# Patient Record
Sex: Female | Born: 1995 | Hispanic: No | Marital: Single | Smoking: Never smoker
Health system: Southern US, Community
[De-identification: ages and names within clinical notes are randomized; demographics above are authoritative.]

## PROBLEM LIST (undated history)

## (undated) DIAGNOSIS — B019 Varicella without complication: Secondary | ICD-10-CM

## (undated) DIAGNOSIS — H539 Unspecified visual disturbance: Secondary | ICD-10-CM

## (undated) DIAGNOSIS — R51 Headache: Secondary | ICD-10-CM

## (undated) DIAGNOSIS — R569 Unspecified convulsions: Secondary | ICD-10-CM

---
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
## 2019-04-01 ENCOUNTER — Encounter (HOSPITAL_COMMUNITY): Payer: Self-pay | Admitting: Emergency Medicine

## 2019-04-01 ENCOUNTER — Ambulatory Visit (HOSPITAL_COMMUNITY)
Admission: EM | Admit: 2019-04-01 | Discharge: 2019-04-01 | Disposition: A | Discharge status: Home / Self Care | Payer: Self-pay | Attending: Family Medicine | Admitting: Family Medicine

## 2019-04-01 ENCOUNTER — Other Ambulatory Visit: Payer: Self-pay

## 2019-04-01 DIAGNOSIS — K5900 Constipation, unspecified: Secondary | ICD-10-CM

## 2019-04-01 DIAGNOSIS — K6289 Other specified diseases of anus and rectum: Secondary | ICD-10-CM

## 2019-04-01 MED ORDER — POLYETHYLENE GLYCOL 3350 17 G PO PACK: 17.0000 g | PACK | Freq: Every day | ORAL | 0 refills | Status: AC

## 2019-04-01 MED ORDER — HYDROCORTISONE ACETATE 25 MG RE SUPP: 25.0000 mg | Freq: Two times a day (BID) | RECTAL | 0 refills | Status: AC

## 2019-04-01 NOTE — ED Triage Notes (Signed)
Pt complains of rectal pain x3 days.  She states she thinks it is a hemorrhoid, but she thinks it is internal.

## 2019-04-01 NOTE — Discharge Instructions (Addendum)
Use the suppositories as prescribed. Recommend MiraLAX.  You can do 1 scoop twice a day or 1 packet twice a day and to get it evaluated. Make sure you can plenty of fluids and hydrated. Recommended if you are unable to have a bowel movement you may need to purchase an enema over the counter Follow up as needed for continued or worsening symptoms

## 2019-04-02 NOTE — ED Provider Notes (Signed)
[facility]    CSN: [id number] Arrival date & time: 04/01/19  0840      History   Chief Complaint No chief complaint on file.   HPI Kathryn Baker is a 24 y.o. female.   Pt is a 24 year old female that presents with rectal pain x 3 days. Symptoms have been constant. She has not had a BM in 3 days and is worried due to the pain with bearing down. Feels like she is impacted. No rectal bleeding or abdominal pain. She had not tried anything for the symptoms. Reporting that her sister looked at the rectal area and did not see any visible hemorrhoids  nor could she feel any.   ROS per HPI      Past Medical History:  Diagnosis Date  . Headache(784.0)   . Seizures ([place])   . Varicella   . Vision abnormalities     Patient Active Problem List   Diagnosis Date Noted  . Seizures ([place]) 01/26/2012    History reviewed. No pertinent surgical history.  OB History   No obstetric history on file.      Home Medications    Prior to Admission medications   Medication Sig Start Date End Date Taking? Authorizing Provider  levETIRAcetam (KEPPRA) 1000 MG tablet Take 1 tablet (1,000 mg total) by mouth 2 (two) times daily. 02/19/12  Yes Janit Bern, MD  hydrocortisone (ANUSOL-HC) 25 MG suppository Place 1 suppository (25 mg total) rectally 2 (two) times daily. 04/01/19   Loura Halt A, NP  ibuprofen (ADVIL,MOTRIN) 200 MG tablet Take 200 mg by mouth daily as needed. For pain    [provider]  polyethylene glycol (MIRALAX / GLYCOLAX) 17 g packet Take 17 g by mouth daily. 04/01/19   Orvan July, NP    Family History Family History  Problem Relation Age of Onset  . Asthma Sister   . Diabetes Maternal Aunt   . Diabetes Maternal Grandfather     Social History Social History   Tobacco Use  . Smoking status: Never Smoker  . Smokeless tobacco: Never Used  Substance Use Topics  . Alcohol use: No  . Drug use: No     Allergies   Penicillins   Review of  Systems Review of Systems   Physical Exam Triage Vital Signs ED Triage Vitals  Enc Vitals Group     BP 04/01/19 0905 (!) 114/42     Pulse Rate 04/01/19 0905 74     Resp 04/01/19 0905 18     Temp 04/01/19 0905 98.6 F (37 C)     Temp Source 04/01/19 0905 Oral     SpO2 04/01/19 0905 99 %     Weight --      Height --      Head Circumference --      Peak Flow --      Pain Score 04/01/19 0902 7     Pain Loc --      Pain Edu? --      Excl. in [place]? --    No data found.  Updated Vital Signs BP (!) 114/42 (BP Location: Right Arm)   Pulse 74   Temp 98.6 F (37 C) (Oral)   Resp 18   LMP 03/16/2019   SpO2 99%   Visual Acuity Right Eye Distance:   Left Eye Distance:   Bilateral Distance:    Right Eye Near:   Left Eye Near:    Bilateral Near:  Physical Exam Vitals and nursing note reviewed.  Constitutional:      General: She is not in acute distress.    Appearance: Normal appearance. She is not ill-appearing, toxic-appearing or diaphoretic.  HENT:     Head: Normocephalic.     Nose: Nose normal.     Mouth/Throat:     Pharynx: Oropharynx is clear.  Eyes:     Conjunctiva/sclera: Conjunctivae normal.  Pulmonary:     Effort: Pulmonary effort is normal.  Abdominal:     Palpations: Abdomen is soft.     Tenderness: There is no abdominal tenderness.  Genitourinary:    Comments: Deferred.  Musculoskeletal:        General: Normal range of motion.     Cervical back: Normal range of motion.  Skin:    General: Skin is warm and dry.     Findings: No rash.  Neurological:     Mental Status: She is alert.  Psychiatric:        Mood and Affect: Mood normal.      UC Treatments / Results  Labs (all labs ordered are listed, but only abnormal results are displayed) Labs Reviewed - No data to display  EKG   Radiology No results found.  Procedures Procedures (including critical care time)  Medications Ordered in UC Medications - No data to display  Initial  Impression / Assessment and Plan / UC Course  I have reviewed the triage vital signs and the nursing notes.  Pertinent labs & imaging results that were available during my care of the patient were reviewed by me and considered in my medical decision making (see chart for details).     Constipation- rectal pain most likely from impaction of large stool.  Will do miralax and rectal suppository. Recommended that she may need to do enema if this doesn't work  Final Clinical Impressions(s) / UC Diagnoses   Final diagnoses:  Rectal pain  Constipation, unspecified constipation type     Discharge Instructions     Use the suppositories as prescribed. Recommend MiraLAX.  You can do 1 scoop twice a day or 1 packet twice a day and to get it evaluated. Make sure you can plenty of fluids and hydrated. Recommended if you are unable to have a bowel movement you may need to purchase an enema over the counter Follow up as needed for continued or worsening symptoms     ED Prescriptions    Medication Sig Dispense Auth. Provider   polyethylene glycol (MIRALAX / GLYCOLAX) 17 g packet Take 17 g by mouth daily. 14 each Gradyn Shein A, NP   hydrocortisone (ANUSOL-HC) 25 MG suppository Place 1 suppository (25 mg total) rectally 2 (two) times daily. 12 suppository Brandis Matsuura A, NP     PDMP not reviewed this encounter.   Janace Aris, NP 04/02/19 [phone]

## 2019-10-17 ENCOUNTER — Ambulatory Visit (HOSPITAL_COMMUNITY)
Admission: EM | Admit: 2019-10-17 | Discharge: 2019-10-17 | Disposition: A | Discharge status: Home / Self Care | Payer: HRSA Program | Attending: Emergency Medicine | Admitting: Emergency Medicine

## 2019-10-17 ENCOUNTER — Other Ambulatory Visit: Payer: Self-pay

## 2019-10-17 ENCOUNTER — Encounter (HOSPITAL_COMMUNITY): Payer: Self-pay

## 2019-10-17 DIAGNOSIS — Z20822 Contact with and (suspected) exposure to covid-19: Secondary | ICD-10-CM | POA: Diagnosis present

## 2019-10-17 NOTE — Discharge Instructions (Signed)
Please take ibuprofen and tylenol as needed. You will need to quarantine for 14 days.

## 2019-10-17 NOTE — ED Provider Notes (Addendum)
[facility]    CSN: [id number] Arrival date & time: 10/17/19  1229      History   Chief Complaint Chief Complaint  Patient presents with  . covid 19    HPI Kathryn Baker is a 24 y.o. female. she reports symptoms of tension headache, diarrhea, fatigue, coughing, chills. Denies fevers. Denies having a COVID vaccine. But has a positive household contact. Taking ibuprofen and a cold and flu medication. She has just found out her daughter tested positive for COVID.    HPI  Past Medical History:  Diagnosis Date  . Headache(784.0)   . Seizures (HCC)   . Varicella   . Vision abnormalities     Patient Active Problem List   Diagnosis Date Noted  . Seizures (HCC) 01/26/2012    History reviewed. No pertinent surgical history.  OB History   No obstetric history on file.      Home Medications    Prior to Admission medications   Medication Sig Start Date End Date Taking? Authorizing Provider  hydrocortisone (ANUSOL-HC) 25 MG suppository Place 1 suppository (25 mg total) rectally 2 (two) times daily. 04/01/19   Dahlia Byes A, NP  ibuprofen (ADVIL,MOTRIN) 200 MG tablet Take 200 mg by mouth daily as needed. For pain    [provider]  levETIRAcetam (KEPPRA) 1000 MG tablet Take 1 tablet (1,000 mg total) by mouth 2 (two) times daily. 02/19/12   Keith Rake, MD  polyethylene glycol (MIRALAX / GLYCOLAX) 17 g packet Take 17 g by mouth daily. 04/01/19   Janace Aris, NP    Family History Family History  Problem Relation Age of Onset  . Asthma Sister   . Diabetes Maternal Aunt   . Diabetes Maternal Grandfather     Social History Social History   Tobacco Use  . Smoking status: Never Smoker  . Smokeless tobacco: Never Used  Substance Use Topics  . Alcohol use: No  . Drug use: No     Allergies   Penicillins   Review of Systems Review of Systems  Constitutional: Positive for fatigue. Negative for fever.  Respiratory: Positive for cough.     Neurological: Positive for headaches. Negative for dizziness.  All other systems reviewed and are negative.    Physical Exam Triage Vital Signs ED Triage Vitals  Enc Vitals Group     BP 10/17/19 1337 134/81     Pulse Rate 10/17/19 1337 91     Resp 10/17/19 1337 18     Temp 10/17/19 1337 98.5 F (36.9 C)     Temp Source 10/17/19 1337 Oral     SpO2 10/17/19 1337 99 %     Weight --      Height --      Head Circumference --      Peak Flow --      Pain Score 10/17/19 1338 0     Pain Loc --      Pain Edu? --      Excl. in GC? --    No data found.  Updated Vital Signs BP 134/81 (BP Location: Right Arm)   Pulse 91   Temp 98.5 F (36.9 C) (Oral)   Resp 18   LMP 10/06/2019   SpO2 99%    Physical Exam Vitals reviewed.  Constitutional:      Appearance: Normal appearance. She is normal weight.  HENT:     Head: Normocephalic and atraumatic.     Right Ear: Tympanic membrane normal.  Left Ear: Tympanic membrane normal.     Nose: Nose normal.     Mouth/Throat:     Mouth: Mucous membranes are moist.     Pharynx: No oropharyngeal exudate or posterior oropharyngeal erythema.  Eyes:     General:        Right eye: No discharge.        Left eye: No discharge.     Pupils: Pupils are equal, round, and reactive to light.  Cardiovascular:     Rate and Rhythm: Normal rate and regular rhythm.     Pulses: Normal pulses.     Heart sounds: Normal heart sounds.  Pulmonary:     Effort: Pulmonary effort is normal. No respiratory distress.  Abdominal:     General: Abdomen is flat. There is no distension.  Musculoskeletal:     Cervical back: Normal range of motion and neck supple.  Skin:    General: Skin is warm.     Coloration: Skin is not jaundiced or pale.  Neurological:     Mental Status: She is alert.  Psychiatric:        Mood and Affect: Mood normal.        Behavior: Behavior normal.        Thought Content: Thought content normal.        Judgment: Judgment normal.       UC Treatments / Results  Labs (all labs ordered are listed, but only abnormal results are displayed) Labs Reviewed  SARS CORONAVIRUS 2 (TAT 6-24 HRS)    EKG   Radiology No results found.  Procedures Procedures (including critical care time)  Medications Ordered in UC Medications - No data to display  Initial Impression / Assessment and Plan / UC Course  I have reviewed the triage vital signs and the nursing notes.  Pertinent labs & imaging results that were available during my care of the patient were reviewed by me and considered in my medical decision making (see chart for details).     Close contact with positive COVID pt Final Clinical Impressions(s) / UC Diagnoses   Final diagnoses:  Close exposure to COVID-19 virus     Discharge Instructions     Please take ibuprofen and tylenol as needed. You will need to quarantine for 14 days.     ED Prescriptions    None     PDMP not reviewed this encounter.   Chilton Si, PA-C 10/17/19 1447    Chilton Si, PA-C 10/17/19 1448

## 2019-10-17 NOTE — ED Triage Notes (Signed)
Pt present exposure to Covid 19, symptoms started a few days ago. Pt daughter has tested positive for covid 19 on 10/15/19

## 2019-10-18 ENCOUNTER — Other Ambulatory Visit: Payer: Self-pay | Admitting: Oncology

## 2019-10-18 DIAGNOSIS — U071 COVID-19: Secondary | ICD-10-CM

## 2019-10-18 LAB — SARS CORONAVIRUS 2 (TAT 6-24 HRS): SARS Coronavirus 2: POSITIVE — AB

## 2019-10-18 NOTE — Progress Notes (Signed)
I connected by phone with  Ms. Kathryn Baker on 11/13/2019 at 2pm to discuss the potential use of an new treatment for mild to moderate COVID-19 viral infection in non-hospitalized patients.   This patient is a age/sex that meets the FDA criteria for Emergency Use Authorization of casirivimab\imdevimab.  Has a (+) direct SARS-CoV-2 viral test result 1. Has mild or moderate COVID-19  2. Is ? 23 years of age and weighs ? 40 kg 3. Is NOT hospitalized due to COVID-19 4. Is NOT requiring oxygen therapy or requiring an increase in baseline oxygen flow rate due to COVID-19 5. Is within 10 days of symptom onset 6. Has at least one of the high risk factor(s) for progression to severe COVID-19 and/or hospitalization as defined in EUA. ? Specific high risk criteria : Obesity, seizure disorder   Symptom onset  10/14/2019.   I have spoken and communicated the following to the patient or parent/caregiver:   1. FDA has authorized the emergency use of casirivimab\imdevimab for the treatment of mild to moderate COVID-19 in adults and pediatric patients with positive results of direct SARS-CoV-2 viral testing who are 52 years of age and older weighing at least 40 kg, and who are at high risk for progressing to severe COVID-19 and/or hospitalization.   2. The significant known and potential risks and benefits of casirivimab\imdevimab, and the extent to which such potential risks and benefits are unknown.   3. Information on available alternative treatments and the risks and benefits of those alternatives, including clinical trials.   4. Patients treated with casirivimab\imdevimab should continue to self-isolate and use infection control measures (e.g., wear mask, isolate, social distance, avoid sharing personal items, clean and disinfect "high touch" surfaces, and frequent handwashing) according to CDC guidelines.    5. The patient or parent/caregiver has the option to accept or refuse casirivimab\imdevimab .   After  reviewing this information with the patient, The patient agreed to proceed with receiving casirivimab\imdevimab infusion and will be provided a copy of the Fact sheet prior to receiving the infusion.Mignon Pine, AGNP-C [phone] (Infusion Center Hotline)

## 2019-10-20 MED ORDER — SODIUM CHLORIDE 0.9 % IV SOLN
1200.0000 mg | Freq: Once | INTRAVENOUS | Status: DC
  Filled 2019-10-20: qty 10

## 2019-10-21 ENCOUNTER — Ambulatory Visit (HOSPITAL_COMMUNITY)
Admission: RE | Admit: 2019-10-21 | Discharge: 2019-10-21 | Disposition: A | Discharge status: Home / Self Care | Payer: Medicaid Other | Source: Ambulatory Visit | Attending: Pulmonary Disease | Admitting: Pulmonary Disease

## 2019-10-21 DIAGNOSIS — U071 COVID-19: Secondary | ICD-10-CM

## 2019-10-27 ENCOUNTER — Other Ambulatory Visit: Payer: Self-pay | Admitting: Critical Care Medicine

## 2019-10-27 ENCOUNTER — Other Ambulatory Visit: Payer: Medicaid Other

## 2019-10-27 DIAGNOSIS — Z20822 Contact with and (suspected) exposure to covid-19: Secondary | ICD-10-CM

## 2019-10-29 LAB — NOVEL CORONAVIRUS, NAA: SARS-CoV-2, NAA: NOT DETECTED

## 2019-10-29 LAB — SARS-COV-2, NAA 2 DAY TAT

## 2020-10-15 ENCOUNTER — Encounter (HOSPITAL_COMMUNITY): Payer: Self-pay

## 2020-10-15 ENCOUNTER — Ambulatory Visit (HOSPITAL_COMMUNITY)
Admission: EM | Admit: 2020-10-15 | Discharge: 2020-10-15 | Disposition: A | Discharge status: Home / Self Care | Payer: Medicaid Other | Attending: Emergency Medicine | Admitting: Emergency Medicine

## 2020-10-15 DIAGNOSIS — B9689 Other specified bacterial agents as the cause of diseases classified elsewhere: Secondary | ICD-10-CM

## 2020-10-15 DIAGNOSIS — N760 Acute vaginitis: Secondary | ICD-10-CM | POA: Insufficient documentation

## 2020-10-15 DIAGNOSIS — N898 Other specified noninflammatory disorders of vagina: Secondary | ICD-10-CM | POA: Insufficient documentation

## 2020-10-15 MED ORDER — METRONIDAZOLE 500 MG PO TABS: 500.0000 mg | ORAL_TABLET | Freq: Two times a day (BID) | ORAL | 0 refills | Status: AC

## 2020-10-15 NOTE — Discharge Instructions (Addendum)
Take the medication as written. Give [place] a working phone number so that we can contact you if needed. Refrain from sexual contact until you know your results and your partner(s) are treated if necessary. Return to the ER if you get worse, have a fever >100.4, or for any concerns.   Below is a list of primary care practices who are taking new patients for you to follow-up with.  [facility] internal medicine clinic Ground Floor - [facility], [address] [place], [place], [place] [address] [phone]  [facility] at [facility] [address] [place], [place] [address] [phone]  [facility] [address] [place], [place] [address] [phone]  [facility] [phone] [address] [place] [address]  [facility]: [address] [place] [address]  [phone]  [facility]: [address] [place] [address]  [phone]  [place] [facility] : [address] [place] [address] [phone]  [facility]: [address] [place] [address] [phone]  [facility]: [address] [place] [address] [phone]  Dr. Oneal Grout [address] [facility] [place] [address]  [phone]  Go to www.goodrx.com  or www.costplusdrugs.com to look up your medications. This will give you a list of where you can find your prescriptions at the most affordable prices. Or ask the pharmacist what the cash price is, or if they have any other discount programs available to help make your medication more affordable. This can be less expensive than what you would pay with insurance.

## 2020-10-15 NOTE — ED Provider Notes (Signed)
HPI  SUBJECTIVE:  Kathryn Baker is a 25 y.o. female who presents with 1.5 weeks of thin, grayish-white vaginal discharge.  She states "I think I have BV".  No vaginal bleeding, rash, genital swelling or ulcers.  No itching.  No fevers, nausea, vomiting, abdominal, back, pelvic pain.  She is in a long-term monogamous relationship with a female who is asymptomatic.  She however would like to check for STDs.  No recent antibiotics.  She has been taking probiotics.  No aggravating or alleviating factors.  She has a remote history of trichomonas, history of BV and yeast.  No history of diabetes.  LMP: 7/18.  Denies possibility being pregnant.  PMD: None  Past Medical History:  Diagnosis Date   Headache(784.0)    Seizures (HCC)    Varicella    Vision abnormalities     History reviewed. No pertinent surgical history.  Family History  Problem Relation Age of Onset   Asthma Sister    Diabetes Maternal Aunt    Diabetes Maternal Grandfather     Social History   Tobacco Use   Smoking status: Never   Smokeless tobacco: Never  Substance Use Topics   Alcohol use: No   Drug use: No    No current facility-administered medications for this encounter.  Current Outpatient Medications:    metroNIDAZOLE (FLAGYL) 500 MG tablet, Take 1 tablet (500 mg total) by mouth 2 (two) times daily for 7 days., Disp: 14 tablet, Rfl: 0   hydrocortisone (ANUSOL-HC) 25 MG suppository, Place 1 suppository (25 mg total) rectally 2 (two) times daily., Disp: 12 suppository, Rfl: 0   ibuprofen (ADVIL,MOTRIN) 200 MG tablet, Take 200 mg by mouth daily as needed. For pain, Disp: , Rfl:    levETIRAcetam (KEPPRA) 1000 MG tablet, Take 1 tablet (1,000 mg total) by mouth 2 (two) times daily., Disp: 30 tablet, Rfl: 2   polyethylene glycol (MIRALAX / GLYCOLAX) 17 g packet, Take 17 g by mouth daily., Disp: 14 each, Rfl: 0  Allergies  Allergen Reactions   Penicillins Hives     ROS  As noted in HPI.   Physical Exam  BP  120/65 (BP Location: Left Arm)   Pulse 76   Temp 98.1 F (36.7 C) (Oral)   Resp 18   LMP 09/25/2020   SpO2 100%   Constitutional: Well developed, well nourished, no acute distress Eyes:  EOMI, conjunctiva normal bilaterally HENT: Normocephalic, atraumatic,mucus membranes moist Respiratory: Normal inspiratory effort Cardiovascular: Normal rate GI: nondistended soft, nontender. No suprapubic, leg tenderness  back: No CVA tenderness GU: Deferred skin: No rash, skin intact Musculoskeletal: no deformities Neurologic: Alert & oriented x 3, no focal neuro deficits Psychiatric: Speech and behavior appropriate   ED Course   Medications - No data to display  No orders of the defined types were placed in this encounter.   No results found for this or any previous visit (from the past 24 hour(s)). No results found.  ED Clinical Impression  1. Vaginal discharge   2. BV (bacterial vaginosis)     ED Assessment/Plan  H&P most c/w  BV. Sent off GC/chlamydia, wet prep. Will not treat empirically now. . Will send home with flagyl. Advised pt to refrain from sexual contact until she knows lab results, symptoms resolve, and partner(s) are treated if necessary. Pt provided working phone number. Follow-up with PMD of choice as needed.  Will provide primary care list and order assistance in finding a PMD.  Discussed labs, MDM, plan and followup  with patient. Pt agrees with plan.   Meds ordered this encounter  Medications   metroNIDAZOLE (FLAGYL) 500 MG tablet    Sig: Take 1 tablet (500 mg total) by mouth 2 (two) times daily for 7 days.    Dispense:  14 tablet    Refill:  0    *This clinic note was created using Scientist, clinical (histocompatibility and immunogenetics). Therefore, there may be occasional mistakes despite careful proofreading.  ?     Domenick Gong, MD 10/15/20 1235

## 2020-10-15 NOTE — ED Triage Notes (Signed)
Pt presents with abnormal vaginal discharge for over a week.

## 2020-10-16 LAB — CERVICOVAGINAL ANCILLARY ONLY
Bacterial Vaginitis (gardnerella): POSITIVE — AB
Candida Glabrata: NEGATIVE
Candida Vaginitis: NEGATIVE
Chlamydia: NEGATIVE
Comment: NEGATIVE
Comment: NEGATIVE
Comment: NEGATIVE
Comment: NEGATIVE
Comment: NEGATIVE
Comment: NORMAL
Neisseria Gonorrhea: NEGATIVE
Trichomonas: NEGATIVE

## 2020-10-24 ENCOUNTER — Encounter: Payer: Self-pay | Admitting: *Deleted
